# Patient Record
Sex: Male | Born: 1985 | Race: Black or African American | Hispanic: No | Marital: Single | Smoking: Former smoker
Health system: Southern US, Community
[De-identification: ages and names within clinical notes are randomized; demographics above are authoritative.]

## PROBLEM LIST (undated history)

## (undated) DIAGNOSIS — M109 Gout, unspecified: Secondary | ICD-10-CM

## (undated) DIAGNOSIS — I10 Essential (primary) hypertension: Secondary | ICD-10-CM

---
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
## 2006-09-03 ENCOUNTER — Emergency Department: Payer: Self-pay | Admitting: Emergency Medicine

## 2007-05-11 ENCOUNTER — Emergency Department: Payer: Self-pay | Admitting: Emergency Medicine

## 2008-03-05 ENCOUNTER — Emergency Department: Payer: Self-pay | Admitting: Emergency Medicine

## 2011-04-23 ENCOUNTER — Ambulatory Visit: Payer: Self-pay | Admitting: Internal Medicine

## 2013-01-30 ENCOUNTER — Ambulatory Visit: Payer: Self-pay | Admitting: Emergency Medicine

## 2014-10-29 ENCOUNTER — Ambulatory Visit
Admission: EM | Admit: 2014-10-29 | Discharge: 2014-10-29 | Disposition: A | Discharge status: Home / Self Care | Payer: 59 | Attending: Family Medicine | Admitting: Family Medicine

## 2014-10-29 ENCOUNTER — Ambulatory Visit: Payer: 59

## 2014-10-29 ENCOUNTER — Encounter: Payer: Self-pay | Admitting: Emergency Medicine

## 2014-10-29 DIAGNOSIS — F17200 Nicotine dependence, unspecified, uncomplicated: Secondary | ICD-10-CM | POA: Diagnosis not present

## 2014-10-29 DIAGNOSIS — J40 Bronchitis, not specified as acute or chronic: Secondary | ICD-10-CM | POA: Diagnosis not present

## 2014-10-29 DIAGNOSIS — R0981 Nasal congestion: Secondary | ICD-10-CM | POA: Diagnosis present

## 2014-10-29 MED ORDER — HYDROCOD POLST-CPM POLST ER 10-8 MG/5ML PO SUER: 5.0000 mL | Freq: Two times a day (BID) | ORAL | Status: DC

## 2014-10-29 MED ORDER — ALBUTEROL SULFATE HFA 108 (90 BASE) MCG/ACT IN AERS
1.0000 | INHALATION_SPRAY | Freq: Four times a day (QID) | RESPIRATORY_TRACT | Status: DC | PRN
Start: 2014-10-29 — End: 2016-05-08

## 2014-10-29 MED ORDER — AZITHROMYCIN 250 MG PO TABS
ORAL_TABLET | ORAL | Status: DC
Start: 2014-10-29 — End: 2016-05-08

## 2014-10-29 NOTE — ED Provider Notes (Signed)
CSN: [id number]     Arrival date & time 10/29/14  0730 History   First MD Initiated Contact with Patient 10/29/14 [phone]     Chief Complaint  Patient presents with  . Nasal Congestion   (Consider location/radiation/quality/duration/timing/severity/associated sxs/prior Treatment) HPI This a 29 year old gentleman who presents with a one-week history of difficulty breathing and chest congestion mildly productive cough tightness in his chest and back that has worsened over the last day or so. In addition he has a sore throat from trying to cough to produce phlegm. The sputum is whitish without any mention of green or yellow. He is a smoker of a half a pack per week and has smoked for many years. He works in a factory with "a Orthoptist. In the past he has been diagnosed with bronchitis but this is been several years ago.  History reviewed. No pertinent past medical history. History reviewed. No pertinent past surgical history. History reviewed. No pertinent family history. Social History  Substance Use Topics  . Smoking status: Current Every Day Smoker  . Smokeless tobacco: None  . Alcohol Use: Yes    Review of Systems  HENT: Positive for congestion and sore throat.   Respiratory: Positive for cough, chest tightness, shortness of breath and wheezing.   All other systems reviewed and are negative.   Allergies  Review of patient's allergies indicates no known allergies.  Home Medications   Prior to Admission medications   Medication Sig Start Date End Date Taking? Authorizing Provider  albuterol (PROVENTIL HFA;VENTOLIN HFA) 108 (90 BASE) MCG/ACT inhaler Inhale 1-2 puffs into the lungs every 6 (six) hours as needed for wheezing or shortness of breath. 10/29/14   Lutricia Feil, PA-C  azithromycin (ZITHROMAX Z-PAK) 250 MG tablet Take as per package instructions. 10/29/14   Lutricia Feil, PA-C  chlorpheniramine-HYDROcodone (TUSSIONEX PENNKINETIC ER) 10-8  MG/5ML SUER Take 5 mLs by mouth 2 (two) times daily. 10/29/14   Chrissie Noa Chaselyn Nanney, PA-C   BP 156/103 mmHg  Pulse 72  Temp(Src) 98 F (36.7 C) (Tympanic)  Resp 16  Ht  (1.753 m)  Wt 205 lb (92.987 kg)  BMI 30.26 kg/m2  SpO2 99% Physical Exam  Constitutional: He is oriented to person, place, and time. He appears well-developed and well-nourished.  HENT:  Head: Normocephalic and atraumatic.  Right Ear: External ear normal.  Left Ear: External ear normal.  Mouth/Throat: Oropharynx is clear and moist.  Eyes: Pupils are equal, round, and reactive to light.  Neck: Neck supple. No thyromegaly present.  Pulmonary/Chest: No stridor. No respiratory distress. He has wheezes. He has rales.  Musculoskeletal: Normal range of motion. He exhibits no edema or tenderness.  Lymphadenopathy:    He has no cervical adenopathy.  Neurological: He is alert and oriented to person, place, and time.  Skin: Skin is warm and dry.  Psychiatric: He has a normal mood and affect. His behavior is normal. Judgment and thought content normal.  Nursing note and vitals reviewed.   ED Course  Procedures (including critical care time) Labs Review Labs Reviewed - No data to display  Imaging Review Dg Chest 2 View  10/29/2014   CLINICAL DATA:  Chest tightness, difficulty breathing for 2 weeks, former smoking history  EXAM: CHEST  2 VIEW  COMPARISON:  None.  FINDINGS: The heart size and mediastinal contours are within normal limits. Both lungs are clear. The visualized skeletal structures are unremarkable.  IMPRESSION: No active cardiopulmonary disease.  Electronically Signed   By: Dwyane Dee M.D.   On: 10/29/2014 08:46     MDM   1. Bronchitis    New Prescriptions   ALBUTEROL (PROVENTIL HFA;VENTOLIN HFA) 108 (90 BASE) MCG/ACT INHALER    Inhale 1-2 puffs into the lungs every 6 (six) hours as needed for wheezing or shortness of breath.   AZITHROMYCIN (ZITHROMAX Z-PAK) 250 MG TABLET    Take as per package  instructions.   CHLORPHENIRAMINE-HYDROCODONE (TUSSIONEX PENNKINETIC ER) 10-8 MG/5ML SUER    Take 5 mLs by mouth 2 (two) times daily.   Plan: 1. Test/x-ray results and diagnosis reviewed with patient 2. rx as per orders; risks, benefits, potential side effects reviewed with patient 3. Recommend supportive treatment with albuterol,tussiones. Drink fluids. Stop smoking! 4. F/u prn if symptoms worsen or don't improve     Lutricia Feil, PA-C 10/29/14 [phone]

## 2014-10-29 NOTE — Discharge Instructions (Signed)
Upper Respiratory Infection, Adult An upper respiratory infection (URI) is also sometimes known as the common cold. The upper respiratory tract includes the nose, sinuses, throat, trachea, and bronchi. Bronchi are the airways leading to the lungs. Most people improve within 1 week, but symptoms can last up to 2 weeks. A residual cough may last even longer.  CAUSES Many different viruses can infect the tissues lining the upper respiratory tract. The tissues become irritated and inflamed and often become very moist. Mucus production is also common. A cold is contagious. You can easily spread the virus to others by oral contact. This includes kissing, sharing a glass, coughing, or sneezing. Touching your mouth or nose and then touching a surface, which is then touched by another person, can also spread the virus. SYMPTOMS  Symptoms typically develop 1 to 3 days after you come in contact with a cold virus. Symptoms vary from person to person. They may include: 1. Runny nose. 2. Sneezing. 3. Nasal congestion. 4. Sinus irritation. 5. Sore throat. 6. Loss of voice (laryngitis). 7. Cough. 8. Fatigue. 9. Muscle aches. 10. Loss of appetite. 11. Headache. 12. Low-grade fever. DIAGNOSIS  You might diagnose your own cold based on familiar symptoms, since most people get a cold 2 to 3 times a year. Your caregiver can confirm this based on your exam. Most importantly, your caregiver can check that your symptoms are not due to another disease such as strep throat, sinusitis, pneumonia, asthma, or epiglottitis. Blood tests, throat tests, and X-rays are not necessary to diagnose a common cold, but they may sometimes be helpful in excluding other more serious diseases. Your caregiver will decide if any further tests are required. RISKS AND COMPLICATIONS  You may be at risk for a more severe case of the common cold if you smoke cigarettes, have chronic heart disease (such as heart failure) or lung disease (such as  asthma), or if you have a weakened immune system. The very young and very old are also at risk for more serious infections. Bacterial sinusitis, middle ear infections, and bacterial pneumonia can complicate the common cold. The common cold can worsen asthma and chronic obstructive pulmonary disease (COPD). Sometimes, these complications can require emergency medical care and may be life-threatening. PREVENTION  The best way to protect against getting a cold is to practice good hygiene. Avoid oral or hand contact with people with cold symptoms. Wash your hands often if contact occurs. There is no clear evidence that vitamin C, vitamin E, echinacea, or exercise reduces the chance of developing a cold. However, it is always recommended to get plenty of rest and practice good nutrition. TREATMENT  Treatment is directed at relieving symptoms. There is no cure. Antibiotics are not effective, because the infection is caused by a virus, not by bacteria. Treatment may include:  Increased fluid intake. Sports drinks offer valuable electrolytes, sugars, and fluids.  Breathing heated mist or steam (vaporizer or shower).  Eating chicken soup or other clear broths, and maintaining good nutrition.  Getting plenty of rest.  Using gargles or lozenges for comfort.  Controlling fevers with ibuprofen or acetaminophen as directed by your caregiver.  Increasing usage of your inhaler if you have asthma. Zinc gel and zinc lozenges, taken in the first 24 hours of the common cold, can shorten the duration and lessen the severity of symptoms. Pain medicines may help with fever, muscle aches, and throat pain. A variety of non-prescription medicines are available to treat congestion and runny nose. Your caregiver  can make recommendations and may suggest nasal or lung inhalers for other symptoms.  HOME CARE INSTRUCTIONS   Only take over-the-counter or prescription medicines for pain, discomfort, or fever as directed by your  caregiver.  Use a warm mist humidifier or inhale steam from a shower to increase air moisture. This may keep secretions moist and make it easier to breathe.  Drink enough water and fluids to keep your urine clear or pale yellow.  Rest as needed.  Return to work when your temperature has returned to normal or as your caregiver advises. You may need to stay home longer to avoid infecting others. You can also use a face mask and careful hand washing to prevent spread of the virus. SEEK MEDICAL CARE IF:   After the first few days, you feel you are getting worse rather than better.  You need your caregiver's advice about medicines to control symptoms.  You develop chills, worsening shortness of breath, or brown or red sputum. These may be signs of pneumonia.  You develop yellow or brown nasal discharge or pain in the face, especially when you bend forward. These may be signs of sinusitis.  You develop a fever, swollen neck glands, pain with swallowing, or white areas in the back of your throat. These may be signs of strep throat. SEEK IMMEDIATE MEDICAL CARE IF:   You have a fever.  You develop severe or persistent headache, ear pain, sinus pain, or chest pain.  You develop wheezing, a prolonged cough, cough up blood, or have a change in your usual mucus (if you have chronic lung disease).  You develop sore muscles or a stiff neck. Document Released: 08/25/2000 Document Revised: 05/24/2011 Document Reviewed: 06/06/2013 [facility] Patient Information 2015 [place], [place]. This information is not intended to replace advice given to you by your health care provider. Make sure you discuss any questions you have with your health care provider.  How to Use an Inhaler Using your inhaler correctly is very important. Good technique will make sure that the medicine reaches your lungs.  HOW TO USE AN INHALER: 13. Take the cap off the inhaler. 14. If this is the first time using your inhaler, you  need to prime it. Shake the inhaler for 5 seconds. Release four puffs into the air, away from your face. Ask your doctor for help if you have questions. 15. Shake the inhaler for 5 seconds. 16. Turn the inhaler so the bottle is above the mouthpiece. 17. Put your pointer finger on top of the bottle. Your thumb holds the bottom of the inhaler. 18. Open your mouth. 19. Either hold the inhaler away from your mouth (the width of 2 fingers) or place your lips tightly around the mouthpiece. Ask your doctor which way to use your inhaler. 20. Breathe out as much air as possible. 21. Breathe in and push down on the bottle 1 time to release the medicine. You will feel the medicine go in your mouth and throat. 22. Continue to take a deep breath in very slowly. Try to fill your lungs. 23. After you have breathed in completely, hold your breath for 10 seconds. This will help the medicine to settle in your lungs. If you cannot hold your breath for 10 seconds, hold it for as long as you can before you breathe out. 24. Breathe out slowly, through pursed lips. Whistling is an example of pursed lips. 25. If your doctor has told you to take more than 1 puff, wait at least  15-30 seconds between puffs. This will help you get the best results from your medicine. Do not use the inhaler more than your doctor tells you to. 26. Put the cap back on the inhaler. 27. Follow the directions from your doctor or from the inhaler package about cleaning the inhaler. If you use more than one inhaler, ask your doctor which inhalers to use and what order to use them in. Ask your doctor to help you figure out when you will need to refill your inhaler.  If you use a steroid inhaler, always rinse your mouth with water after your last puff, gargle and spit out the water. Do not swallow the water. GET HELP IF:  The inhaler medicine only partially helps to stop wheezing or shortness of breath.  You are having trouble using your  inhaler.  You have some increase in thick spit (phlegm). GET HELP RIGHT AWAY IF:  The inhaler medicine does not help your wheezing or shortness of breath or you have tightness in your chest.  You have dizziness, headaches, or fast heart rate.  You have chills, fever, or night sweats.  You have a large increase of thick spit, or your thick spit is bloody. MAKE SURE YOU:   Understand these instructions.  Will watch your condition.  Will get help right away if you are not doing well or get worse. Document Released: 12/09/2007 Document Revised: 12/20/2012 Document Reviewed: 09/28/2012 [facility] Patient Information 2015 [place], [place]. This information is not intended to replace advice given to you by your health care provider. Make sure you discuss any questions you have with your health care provider.

## 2014-10-29 NOTE — ED Notes (Signed)
Pt with nasal and chest congestion x 2 days

## 2016-05-08 ENCOUNTER — Ambulatory Visit
Admission: EM | Admit: 2016-05-08 | Discharge: 2016-05-08 | Disposition: A | Discharge status: Home / Self Care | Payer: 59 | Attending: Emergency Medicine | Admitting: Emergency Medicine

## 2016-05-08 ENCOUNTER — Ambulatory Visit (INDEPENDENT_AMBULATORY_CARE_PROVIDER_SITE_OTHER): Payer: 59

## 2016-05-08 ENCOUNTER — Encounter: Payer: Self-pay | Admitting: *Deleted

## 2016-05-08 DIAGNOSIS — M79672 Pain in left foot: Secondary | ICD-10-CM

## 2016-05-08 DIAGNOSIS — I10 Essential (primary) hypertension: Secondary | ICD-10-CM | POA: Diagnosis not present

## 2016-05-08 DIAGNOSIS — M722 Plantar fascial fibromatosis: Secondary | ICD-10-CM | POA: Diagnosis not present

## 2016-05-08 HISTORY — DX: Essential (primary) hypertension: I10

## 2016-05-08 LAB — BASIC METABOLIC PANEL
Anion gap: 8 (ref 5–15)
BUN: 12 mg/dL (ref 6–20)
CHLORIDE: 103 mmol/L (ref 101–111)
CO2: 28 mmol/L (ref 22–32)
Calcium: 8.9 mg/dL (ref 8.9–10.3)
Creatinine, Ser: 1.03 mg/dL (ref 0.61–1.24)
GFR calc non Af Amer: >60 mL/min (ref >60)
Glucose, Bld: 105 mg/dL — ABNORMAL HIGH (ref 65–99)
POTASSIUM: 3.5 mmol/L (ref 3.5–5.1)
Sodium: 139 mmol/L (ref 135–145)

## 2016-05-08 MED ORDER — IBUPROFEN 800 MG PO TABS: 800.0000 mg | ORAL_TABLET | Freq: Three times a day (TID) | ORAL | 0 refills | Status: DC

## 2016-05-08 MED ORDER — HYDROCHLOROTHIAZIDE 12.5 MG PO TABS: 12.5000 mg | ORAL_TABLET | Freq: Every day | ORAL | 0 refills | Status: DC

## 2016-05-08 NOTE — ED Provider Notes (Signed)
HPI  SUBJECTIVE:  Terry Buchanan is a 31 y.o. male who presents with 2-3 days of atraumatic sharp, stabbing, achy constant pain in his lateral left foot. States it is also on the sole of his foot. Symptoms are worse with weightbearing, better with ibuprofen 800 mg. Last dose of ibuprofen was last night. He has not tried anything else for this. He thinks that he might of heard a "pop" several days ago, but is not sure. He thinks that there may be some swelling. He has had symptoms like this before in the other foot and was thought to have a stress fracture. No back, leg, ankle pain. He wears steel toed beats but states that they fit well. No bruising, redness, fevers, rash, numbness, tingling. He reports pain with heel strike in the morning. No change in his physical activity. He has a past medical history of hypertension. He was on hydrochlorothiazide. Has not taken BP medicines in a year. Does not have a primary care physician. No history of diabetes or osteoporosis. PMD: None.    Past Medical History:  Diagnosis Date  . Hypertension     History reviewed. No pertinent surgical history.  History reviewed. No pertinent family history.  Social History  Substance Use Topics  . Smoking status: Former Games developermoker  . Smokeless tobacco: Never Used  . Alcohol use Yes    No current facility-administered medications for this encounter.   Current Outpatient Prescriptions:  .  hydrochlorothiazide (HYDRODIURIL) 12.5 MG tablet, Take 1 tablet (12.5 mg total) by mouth daily., Disp: 30 tablet, Rfl: 0 .  ibuprofen (ADVIL,MOTRIN) 800 MG tablet, Take 1 tablet (800 mg total) by mouth 3 (three) times daily., Disp: 30 tablet, Rfl: 0  No Known Allergies   ROS  As noted in HPI.   Physical Exam  BP (!) 166/111 (BP Location: Left Arm)   Pulse 64   Temp 98.6 F (37 C) (Oral)   Resp 16   Ht 5\' 9"  (1.753 m)   Wt 230 lb (104.3 kg)   SpO2 98%   BMI 33.97 kg/m   BP Readings from Last 3 Encounters:   05/08/16 (!) 166/111  10/29/14 (!) 156/103    Constitutional: Well developed, well nourished, no acute distress Eyes:  EOMI, conjunctiva normal bilaterally HENT: Normocephalic, atraumatic,mucus membranes moist Respiratory: Normal inspiratory effort Cardiovascular: Normal rate GI: nondistended skin: No rash, skin intact Musculoskeletal: Ankle nontender. Nontender along the medial or lateral ankle ligaments. No foot erythema,  Bruising. Mild soft tissue swelling.  Midfoot nontender. Positive tenderness to the base fifth metatarsal. Positive tenderness over the plantar fascia. Mild tenderness along the calcaneus at the insertion of the plantar fascia. No rash. Patient able to wiggle all toes. DP 2+. Neurologic: Alert & oriented x 3, no focal neuro deficits Psychiatric: Speech and behavior appropriate   ED Course   Medications - No data to display  Orders Placed This Encounter  Procedures  . DG Foot Complete Left    Standing Status:   Standing    Number of Occurrences:   1    Order Specific Question:   Reason for Exam (SYMPTOM  OR DIAGNOSIS REQUIRED)    Answer:   5th MT tenderness r/o foot fx  . Basic metabolic panel    Standing Status:   Standing    Number of Occurrences:   1    Results for orders placed or performed during the hospital encounter of 05/08/16 (from the past 24 hour(s))  Basic metabolic panel  Status: Abnormal   Collection Time: 05/08/16  9:31 AM  Result Value Ref Range   Sodium 139 135 - 145 mmol/L   Potassium 3.5 3.5 - 5.1 mmol/L   Chloride 103 101 - 111 mmol/L   CO2 28 22 - 32 mmol/L   Glucose, Bld 105 (H) 65 - 99 mg/dL   BUN 12 6 - 20 mg/dL   Creatinine, Ser 4.09 0.61 - 1.24 mg/dL   Calcium 8.9 8.9 - 81.1 mg/dL   GFR calc non Af Amer >60 >60 mL/min   GFR calc Af Amer >60 >60 mL/min   Anion gap 8 5 - 15   Dg Foot Complete Left  Result Date: 05/08/2016 CLINICAL DATA:  Lateral left foot pain for 3 days. No known injury. Initial encounter. EXAM:  LEFT FOOT - COMPLETE 3+ VIEW COMPARISON:  None. FINDINGS: There is no evidence of acute fracture, subluxation or dislocation. Mild degenerative changes at the lateral cuneiform- third metatarsal articulation noted. No other focal bony abnormalities are present. Soft tissue swelling is noted. IMPRESSION: Soft tissue swelling without acute bony abnormality. Degenerative changes of the lateral cuneiform -third metatarsal articulation. Electronically Signed   By: Harmon Pier M.D.   On: 05/08/2016 09:58    ED Clinical Impression  Foot pain, left  Plantar fasciitis of left foot  Hypertension, unspecified type  ED Assessment/Plan  [facility] narcotic database reviewed. No opiate  prescriptions in the past 6 months.  Imaging independently reviewed. Soft tissue swelling. No acute bony abnormalities. Mild degenerative changes in the lateral cuneiform third metatarsal articulation. See radiology report for details.   Presentation most consistent with a plantar fasciitis of his left foot. We will send him home with stretching exercises, heel cup, ibuprofen 800 mg with 1 g of Tylenol 3 times a day, podiatry referral.   Pt hypertensive today.  Has not taken BP meds in a year He is otherwise asymptomatic today. Pt denies any CNS type sx such as HA, visual changes, focal paresis, or new onset seizure activity. Pt denies any CV sx such as CP, dyspnea, palpitations, pedal edema, tearing pain radiating to back or abd. Pt denied any renal sx such as anuria or hematuria. . Discussed importance of lifestyle modifications as important first steps. We'll also check BMP and restart on hydrochlorothiazide 12.5 mg qd to begin with. Pt to f/u with a primary care physician of his choice as OP.   Will provide primary care referral. Patient also may return here for blood pressure recheck.  BMP normal. Potassium is borderline low, we'll have him increase his intake of potassium rich foods. Plan as above.  Discussed labs,  imaging, MDM, plan and followup with patient. Discussed sn/sx that should prompt return to the ED. Patient  agrees with plan.   Meds ordered this encounter  Medications  . ibuprofen (ADVIL,MOTRIN) 800 MG tablet    Sig: Take 1 tablet (800 mg total) by mouth 3 (three) times daily.    Dispense:  30 tablet    Refill:  0  . hydrochlorothiazide (HYDRODIURIL) 12.5 MG tablet    Sig: Take 1 tablet (12.5 mg total) by mouth daily.    Dispense:  30 tablet    Refill:  0    *This clinic note was created using Scientist, clinical (histocompatibility and immunogenetics). Therefore, there may be occasional mistakes despite careful proofreading.  ?   Domenick Gong, MD 05/08/16 1115

## 2016-05-08 NOTE — Discharge Instructions (Signed)
Decrease your salt intake. diet and exercise will lower your blood pressure significantly. It is important to keep your blood pressure under good control, as having a elevated for prolonged periods of time significantly increases your risk of stroke, heart attacks, kidney damage, eye damage, and other problems. Increase your intake of potassium rich foods such as bananas and orange juice. Take a multivitamin that contains potassium minute every day. Measure your blood pressure once a day, preferably at the same time every day. Keep a log of this and bring it to your next doctor's appointment. Return here in a week for blood pressure recheck if you're able to find a primary care physician by then. Return immediately to the ER if you start having chest pain, headache, problems seeing, problems talking, problems walking, if you feel like you're about to pass out, if you do pass out, if you have a seizure, or for any other concerns..Marland Kitchen

## 2016-05-08 NOTE — ED Triage Notes (Signed)
Gradual onset left foot pain and edema over past 4 days. Pt can't state specific incident of injury. States he is on his feet all day at work and stands on concrete wearing steele toe boots.

## 2018-10-18 ENCOUNTER — Other Ambulatory Visit: Payer: Self-pay

## 2018-10-18 DIAGNOSIS — Z20822 Contact with and (suspected) exposure to covid-19: Secondary | ICD-10-CM

## 2018-10-19 LAB — NOVEL CORONAVIRUS, NAA: SARS-CoV-2, NAA: NOT DETECTED

## 2018-12-27 ENCOUNTER — Other Ambulatory Visit: Payer: Self-pay

## 2018-12-27 DIAGNOSIS — Z20822 Contact with and (suspected) exposure to covid-19: Secondary | ICD-10-CM

## 2018-12-28 LAB — NOVEL CORONAVIRUS, NAA: SARS-CoV-2, NAA: NOT DETECTED

## 2019-02-12 ENCOUNTER — Other Ambulatory Visit: Payer: Self-pay

## 2019-02-12 DIAGNOSIS — Z20822 Contact with and (suspected) exposure to covid-19: Secondary | ICD-10-CM

## 2019-02-14 LAB — NOVEL CORONAVIRUS, NAA: SARS-CoV-2, NAA: NOT DETECTED

## 2019-07-10 ENCOUNTER — Ambulatory Visit
Admission: EM | Admit: 2019-07-10 | Discharge: 2019-07-10 | Disposition: A | Discharge status: Home / Self Care | Payer: 59 | Attending: Emergency Medicine | Admitting: Emergency Medicine

## 2019-07-10 ENCOUNTER — Ambulatory Visit (INDEPENDENT_AMBULATORY_CARE_PROVIDER_SITE_OTHER)
Admission: RE | Admit: 2019-07-10 | Discharge: 2019-07-10 | Disposition: A | Discharge status: Home / Self Care | Payer: 59 | Source: Ambulatory Visit | Attending: Emergency Medicine | Admitting: Emergency Medicine

## 2019-07-10 ENCOUNTER — Other Ambulatory Visit: Payer: Self-pay

## 2019-07-10 DIAGNOSIS — M25561 Pain in right knee: Secondary | ICD-10-CM | POA: Diagnosis not present

## 2019-07-10 DIAGNOSIS — I10 Essential (primary) hypertension: Secondary | ICD-10-CM | POA: Diagnosis present

## 2019-07-10 DIAGNOSIS — R030 Elevated blood-pressure reading, without diagnosis of hypertension: Secondary | ICD-10-CM | POA: Insufficient documentation

## 2019-07-10 HISTORY — DX: Gout, unspecified: M10.9

## 2019-07-10 LAB — BASIC METABOLIC PANEL
Anion gap: 8 (ref 5–15)
BUN: 10 mg/dL (ref 6–20)
CO2: 29 mmol/L (ref 22–32)
Calcium: 9 mg/dL (ref 8.9–10.3)
Chloride: 100 mmol/L (ref 98–111)
Creatinine, Ser: 1.16 mg/dL (ref 0.61–1.24)
GFR calc Af Amer: >60 mL/min (ref >60)
GFR calc non Af Amer: >60 mL/min (ref >60)
Glucose, Bld: 110 mg/dL — ABNORMAL HIGH (ref 70–99)
Potassium: 3.5 mmol/L (ref 3.5–5.1)
Sodium: 137 mmol/L (ref 135–145)

## 2019-07-10 MED ORDER — OXYCODONE-ACETAMINOPHEN 5-325 MG PO TABS: 1.0000 | ORAL_TABLET | Freq: Four times a day (QID) | ORAL | 0 refills | Status: DC | PRN

## 2019-07-10 MED ORDER — LISINOPRIL 10 MG PO TABS: 10.0000 mg | ORAL_TABLET | Freq: Every day | ORAL | 0 refills | Status: DC

## 2019-07-10 MED ORDER — PREDNISONE 10 MG PO TABS: ORAL_TABLET | ORAL | 0 refills | Status: DC

## 2019-07-10 NOTE — ED Provider Notes (Signed)
[facility] ____________________________________________  Time seen: Approximately 3:41 PM  I have reviewed the triage vital signs and the nursing notes.   HISTORY  Chief Complaint Knee Pain (Right )  HPI Terry Buchanan is a 34 y.o. male past medical history of gout and hypertension presenting for evaluation of right knee pain present for the last 1 week.  Patient states pain has been present for 1 week but increased this past weekend.  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  Denies any fall, direct injury or abrupt onset.  Denies giving way.  States has had swelling to the knee itself.  Denies any other pain or swelling to the rest of the leg.  Denies recurrent history of this.  Has had previous gout with some similar presentation to right foot.  Denies any fevers, redness, break in skin, chest pain, shortness of breath, dizziness, vision changes or headaches.  Reports otherwise doing well.  Did take Tylenol intermittently which helps some but no resolution.  Activity worsens pain.  Denies other alleviating measures.  Reports has been checking his blood pressure at home recently as well and getting 140s over 100s.   Past Medical History:  Diagnosis Date  . Gout   . Hypertension     There are no problems to display for this patient.   History reviewed. No pertinent surgical history.   No current facility-administered medications for this encounter.  Current Outpatient Medications:  .  ibuprofen (ADVIL,MOTRIN) 800 MG tablet, Take 1 tablet (800 mg total) by mouth 3 (three) times daily., Disp: 30 tablet, Rfl: 0 .  hydrochlorothiazide (HYDRODIURIL) 12.5 MG tablet, Take 1 tablet (12.5 mg total) by mouth daily., Disp: 30 tablet, Rfl: 0 .  lisinopril (ZESTRIL) 10 MG tablet, Take 1 tablet (10 mg total) by mouth daily., Disp: 30 tablet, Rfl: 0 .   oxyCODONE-acetaminophen (PERCOCET/ROXICET) 5-325 MG tablet, Take 1 tablet by mouth every 6 (six) hours as needed for severe pain. Do not drive while taking as can cause drowsiness., Disp: 8 tablet, Rfl: 0 .  predniSONE (DELTASONE) 10 MG tablet, Start 60 mg po day one, then 50 mg po day two, taper by 10 mg daily until complete., Disp: 21 tablet, Rfl: 0  Allergies Patient has no known allergies.  Family History  Problem Relation Age of Onset  . Cancer Father   . Hypertension Father   . Gout Father     Social History Social History   Tobacco Use  . Smoking status: Former Games developer  . Smokeless tobacco: Never Used  Substance Use Topics  . Alcohol use: Yes    Comment: occ  . Drug use: Never    Review of Systems Constitutional: No fever/chills Eyes: No visual changes. ENT: No sore throat. Cardiovascular: Denies chest pain. Respiratory: Denies shortness of breath. Gastrointestinal: No abdominal pain. Musculoskeletal: Positive right knee pain. Skin: Negative for rash. Neurological: Negative for headaches, focal weakness or numbness.    ____________________________________________   PHYSICAL EXAM:  VITAL SIGNS: ED Triage Vitals  Enc Vitals Group     BP 07/10/19 1414 (!) 189/124     Pulse Rate 07/10/19 1414 81     Resp 07/10/19 1414 18     Temp 07/10/19 1414 98.9 F (37.2 C)     Temp src --      SpO2 07/10/19 1414 97 %  Weight --      Height --      Head Circumference --      Peak Flow --      Pain Score 07/10/19 1409 8     Pain Loc --      Pain Edu? --      Excl. in [place]? --    Vitals:   07/10/19 1414 07/10/19 1514  BP: (!) 189/124 (!) 180/123  Pulse: 81   Resp: 18   Temp: 98.9 F (37.2 C)   SpO2: 97%      Constitutional: Alert and oriented. Well appearing and in no acute distress. Eyes: Conjunctivae are normal.  ENT      Head: Normocephalic and atraumatic. Cardiovascular: Normal rate, regular rhythm. Grossly normal heart sounds.  Good peripheral  circulation. Respiratory: Normal respiratory effort without tachypnea nor retractions. Breath sounds are clear and equal bilaterally. No wheezes, rales, rhonchi. Musculoskeletal: It is red with antalgic gait.  Bilateral distal pedal pulses equal and easily palpated. Except: Right knee diffuse tenderness with increased tenderness right lateral knee with effusion, pain with extension and flexion but able to near fully flex and extend, mild pain with anterior and lateral stress, no pain with posterior stress, right lower leg otherwise nontender, no distal edema, no erythema. Neurologic:  Normal speech and language. No gross focal neurologic deficits are appreciated. Skin:  Skin is warm, dry and intact. No rash noted. Psychiatric: Mood and affect are normal. Speech and behavior are normal. Patient exhibits appropriate insight and judgment   ___________________________________________   LABS (all labs ordered are listed, but only abnormal results are displayed)  Labs Reviewed  BASIC METABOLIC PANEL - Abnormal; Notable for the following components:      Result Value   Glucose, Bld 110 (*)    All other components within normal limits   ____________________________________________  RADIOLOGY  DG Knee Complete 4 Views Right  Result Date: 07/10/2019 CLINICAL DATA:  Pain and limitation of motion EXAM: RIGHT KNEE - COMPLETE 4+ VIEW COMPARISON:  None. FINDINGS: Frontal, lateral, and bilateral oblique views were obtained. No fracture or dislocation. There is a moderate joint effusion. There is no joint space narrowing or erosion. IMPRESSION: Moderate joint effusion. No fracture or dislocation. No appreciable underlying arthropathy. Electronically Signed   By: Lowella Grip III M.D.   On: 07/10/2019 15:06   ____________________________________________   PROCEDURES Procedures    INITIAL IMPRESSION / ASSESSMENT AND PLAN / ED COURSE  Pertinent labs & imaging results that were available during  my care of the patient were reviewed by me and considered in my medical decision making (see chart for details).  Well-appearing patient.  Right knee pain gradual onset.  Patient also with hypertension history, not currently on medication and currently hypertensive.  Right knee x-ray as above per radiologist, moderate effusion, no fracture or dislocation.  Suspect inflammatory pain, possible gout.  Will treat with prednisone taper and as needed Percocet as needed for breakthrough pain quantity 8 given.  Follow-up with orthopedic as needed for continued pain.  Over-the-counter knee sleeve for support.  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  Patient also reports he is currently does not have a primary care, encouraged to establish.  Reports he was previously on HCTZ 12.5, but reports he did not feel like this worked as well for him.  Will start patient on 10 mg lisinopril and keep journal, monitor diet and follow-up closely  with primary care.Discussed indication, risks and benefits of medications with patient.   Discussed follow up and return parameters including no resolution or any worsening concerns. Patient verbalized understanding and agreed to plan.   [place] controlled substance database reviewed, no recent controlled substances documented.  ____________________________________________   FINAL CLINICAL IMPRESSION(S) / ED DIAGNOSES  Final diagnoses:  Acute pain of right knee  Elevated blood pressure reading  Hypertension, unspecified type     ED Discharge Orders         Ordered    predniSONE (DELTASONE) 10 MG tablet     07/10/19 1513    oxyCODONE-acetaminophen (PERCOCET/ROXICET) 5-325 MG tablet  Every 6 hours PRN     07/10/19 1513    lisinopril (ZESTRIL) 10 MG tablet  Daily     07/10/19 1523           Note: This dictation was prepared with Dragon dictation along with smaller phrase  technology. Any transcriptional errors that result from this process are unintentional.         Renford Dills, NP 07/10/19 1551

## 2019-07-10 NOTE — ED Triage Notes (Signed)
Increasing R knee pain x 1 week.  No known injury.  Describes as constant, throbbing.  Limited ROM.  Worse with weigh bearing.  Mild swelling.  Not warm to touch.    Took Tylenol at 0100 and 0800 with some relief.

## 2019-07-10 NOTE — Discharge Instructions (Addendum)
Take medication as prescribed. Rest. Drink plenty of fluids. Elevate. Use brace as discussed.   Follow-up with orthopedic as needed for continued pain.  Monitor your blood pressure and follow-up with primary care.  Keep journal.    Return to Urgent care for new or worsening concerns.

## 2020-03-21 ENCOUNTER — Other Ambulatory Visit: Payer: 59

## 2020-03-21 ENCOUNTER — Other Ambulatory Visit: Payer: Self-pay

## 2020-03-21 DIAGNOSIS — Z20822 Contact with and (suspected) exposure to covid-19: Secondary | ICD-10-CM

## 2020-03-26 LAB — NOVEL CORONAVIRUS, NAA: SARS-CoV-2, NAA: NOT DETECTED

## 2021-09-21 IMAGING — CR DG KNEE COMPLETE 4+V*R*
4 series · 4 of 4 positions shown · non-contrast
Comparison: None.

CLINICAL DATA: Pain and limitation of motion

EXAM:
RIGHT KNEE - COMPLETE 4+ VIEW

[knee ap]
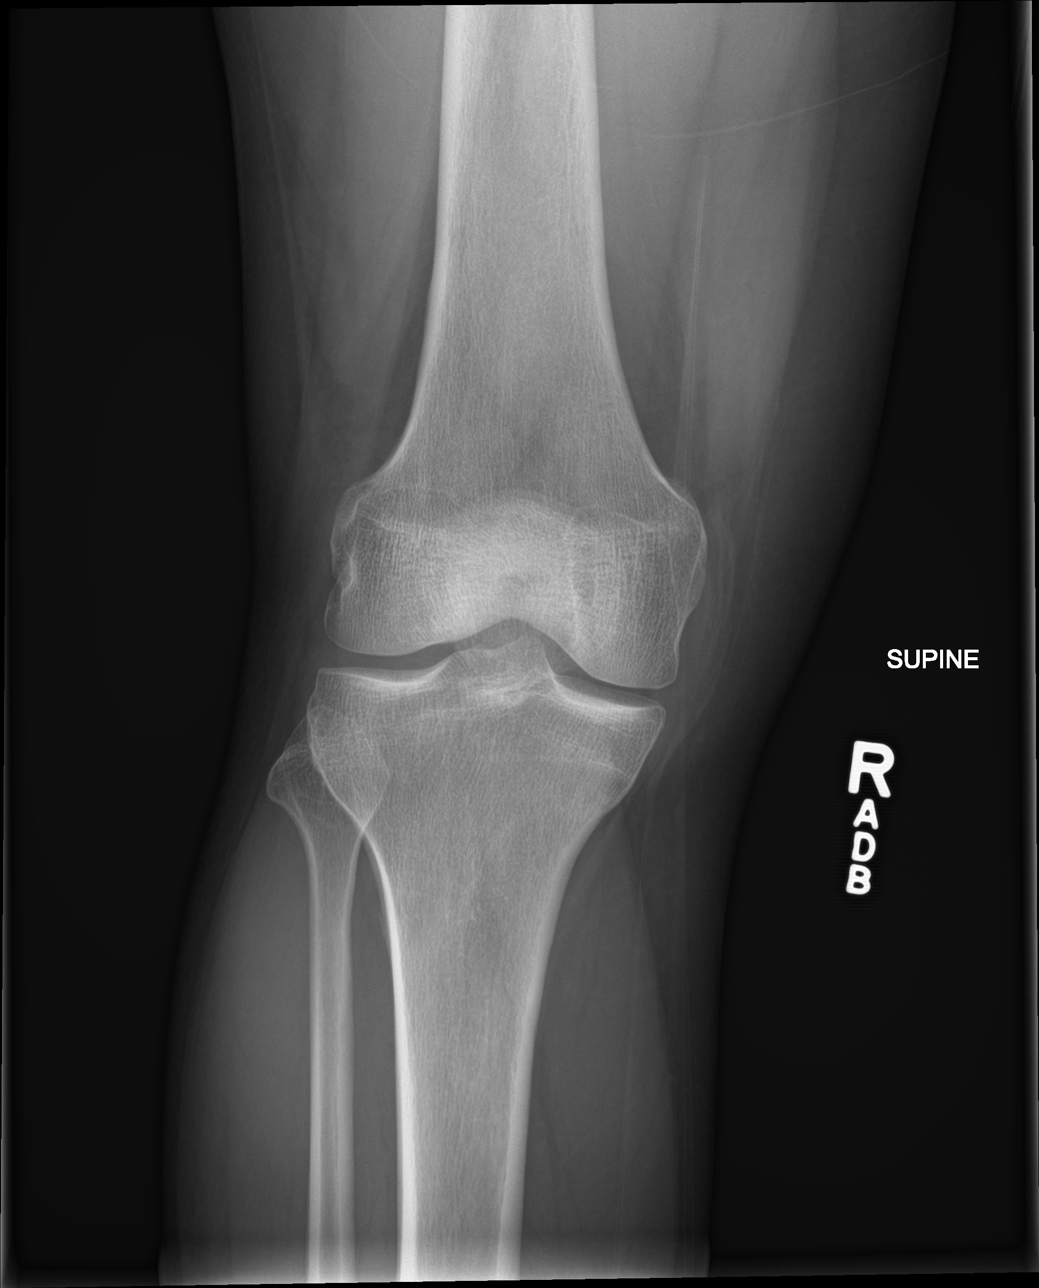

[knee lat]
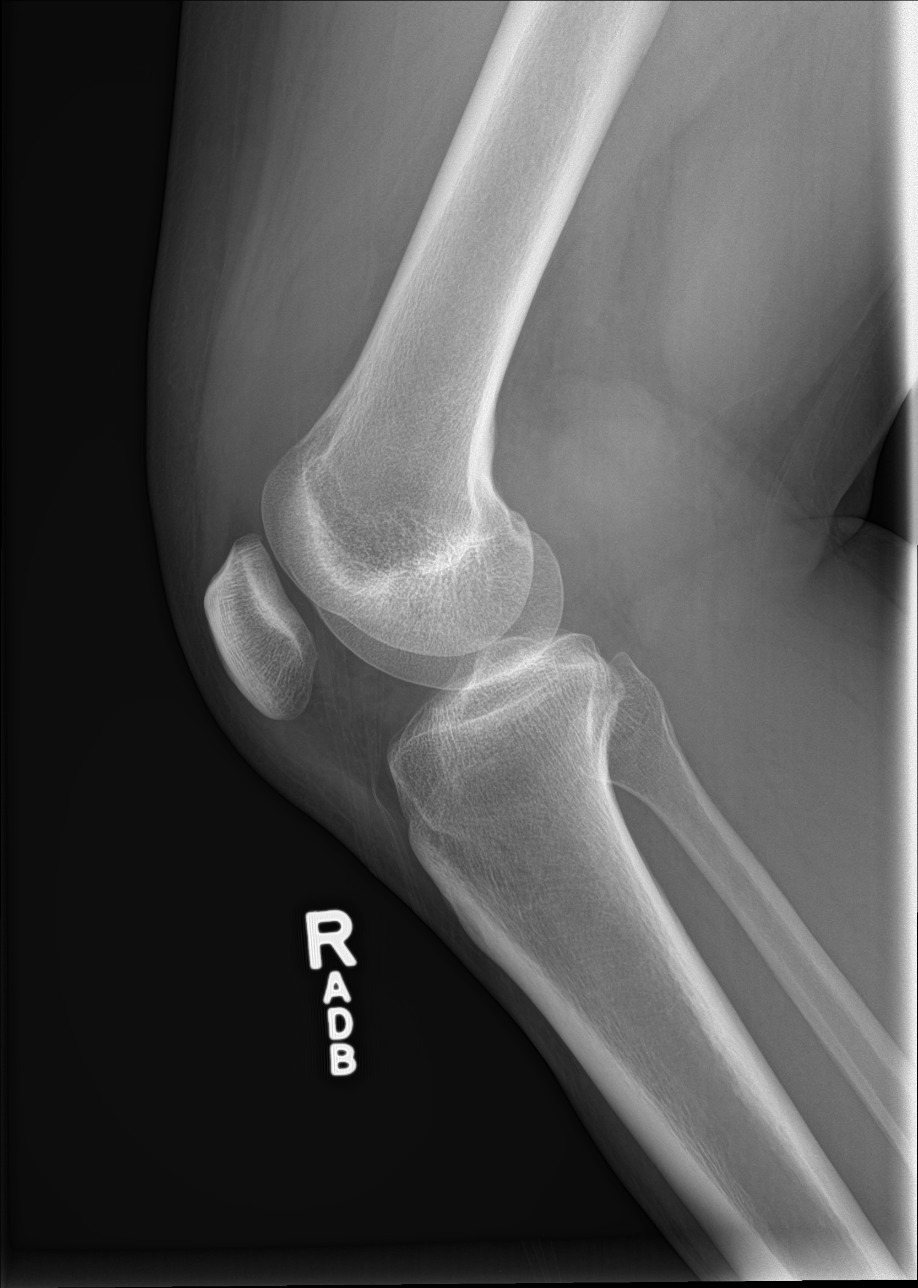

[knee obl (1 of 2)]
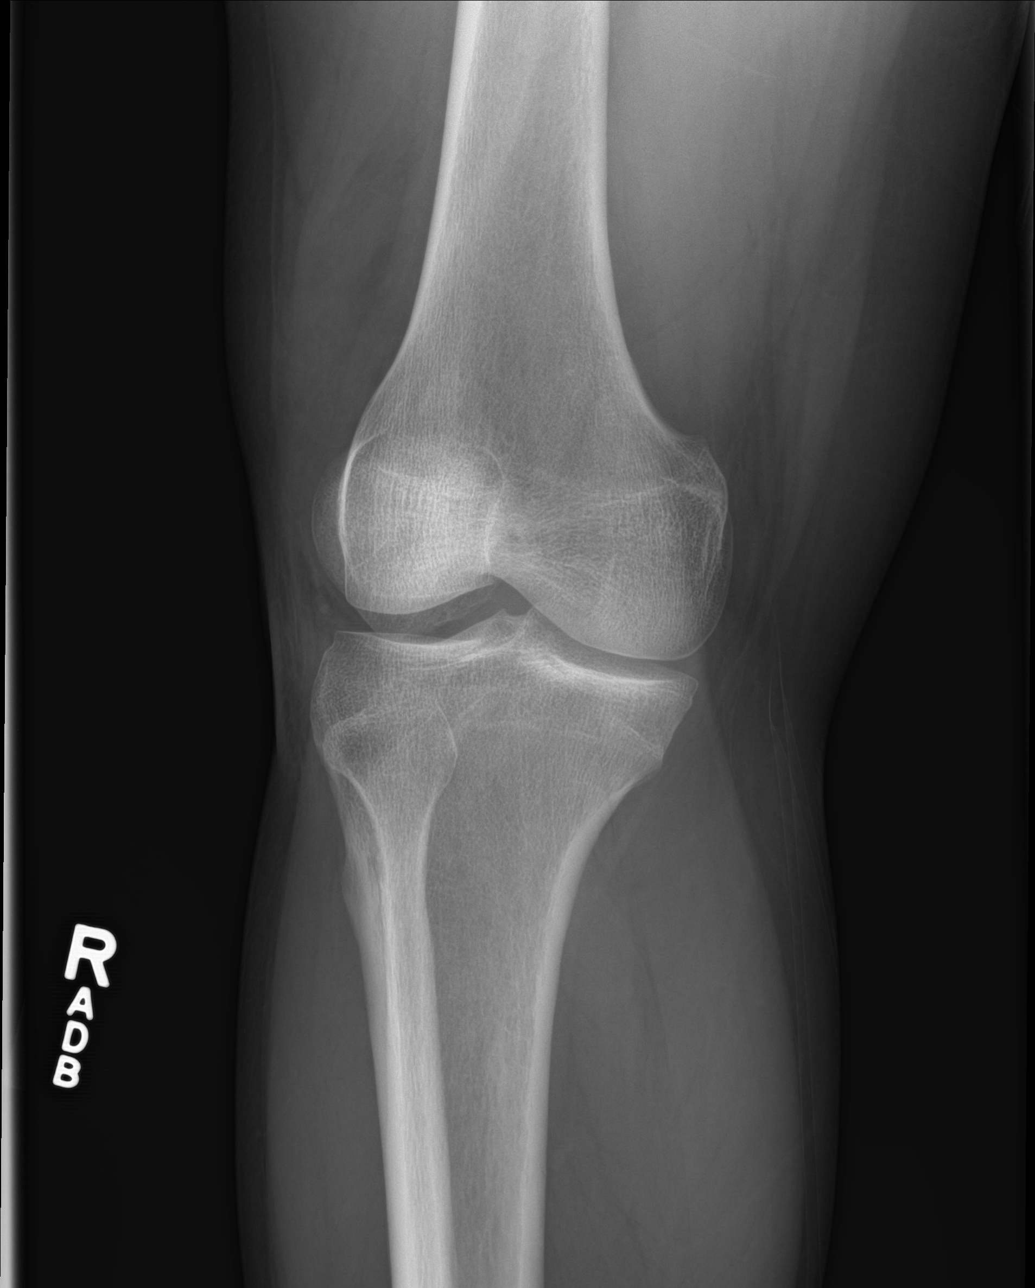

[knee obl (2 of 2)]
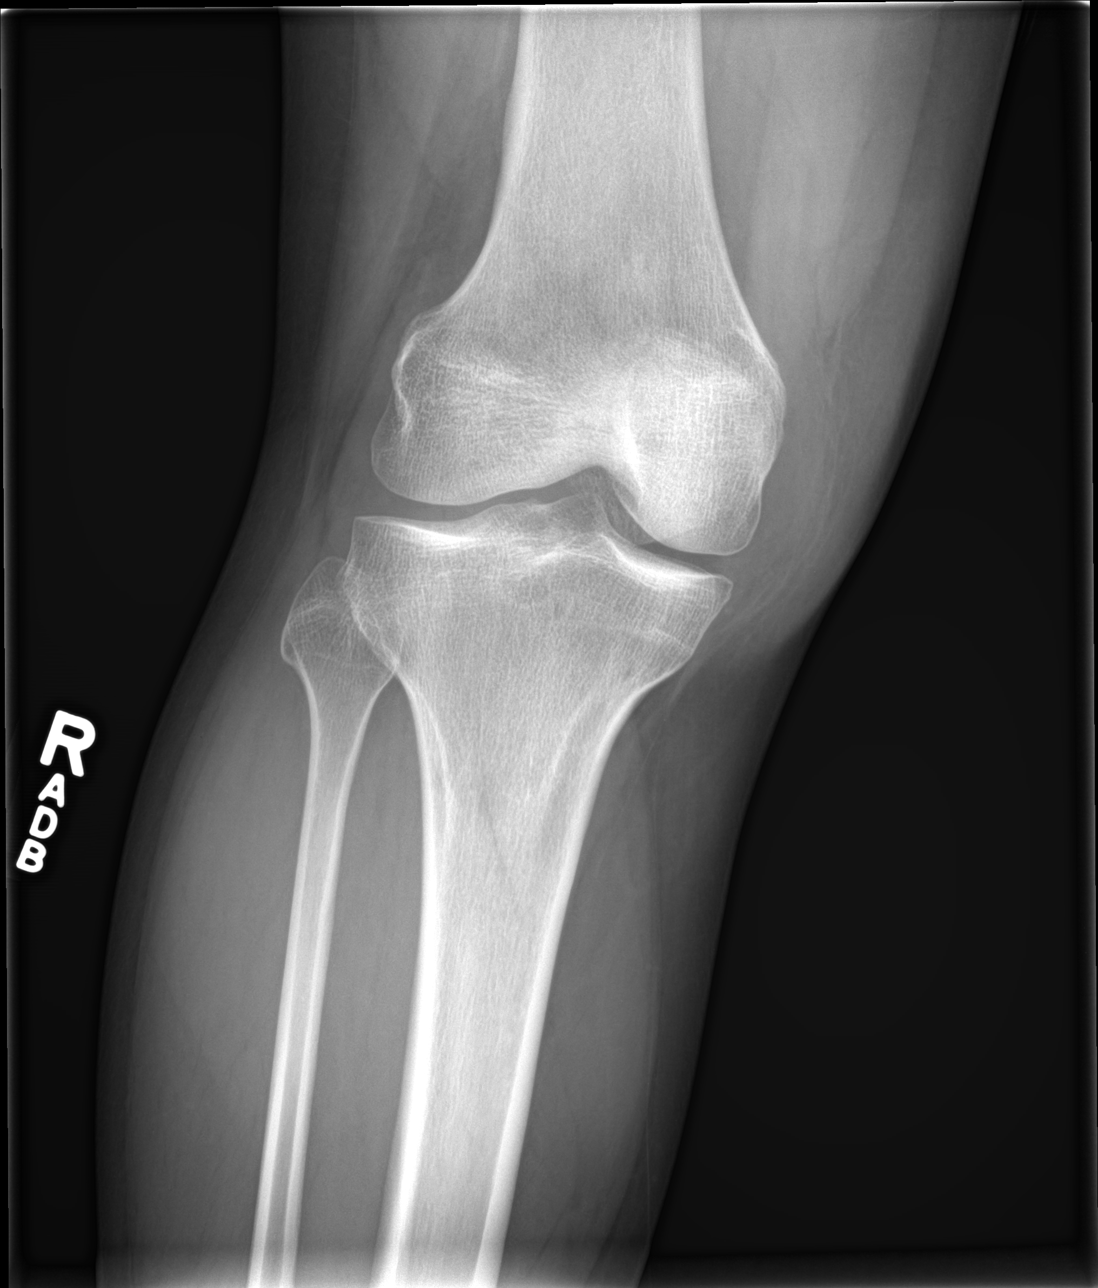

[4 of 4 positions shown; findings below may reference images not displayed]

FINDINGS: Frontal, lateral, and bilateral oblique views were obtained. No
fracture or dislocation. There is a moderate joint effusion. There
is no joint space narrowing or erosion.
IMPRESSION: Moderate joint effusion. No fracture or dislocation. No appreciable
underlying arthropathy.

## 2022-01-14 ENCOUNTER — Ambulatory Visit
Admission: EM | Admit: 2022-01-14 | Discharge: 2022-01-14 | Disposition: A | Discharge status: Home / Self Care | Payer: BC Managed Care – PPO

## 2022-01-14 DIAGNOSIS — M109 Gout, unspecified: Secondary | ICD-10-CM

## 2022-01-14 DIAGNOSIS — M25562 Pain in left knee: Secondary | ICD-10-CM

## 2022-01-14 MED ORDER — DEXAMETHASONE SODIUM PHOSPHATE 10 MG/ML IJ SOLN
10.0000 mg | Freq: Once | INTRAMUSCULAR | Status: AC
  Administered 2022-01-14: 10 mg via INTRAMUSCULAR

## 2022-01-14 MED ORDER — INDOMETHACIN 50 MG PO CAPS: 50.0000 mg | ORAL_CAPSULE | Freq: Three times a day (TID) | ORAL | 1 refills | Status: AC

## 2022-01-14 MED ORDER — HYDROCODONE-ACETAMINOPHEN 7.5-325 MG PO TABS: 1.0000 | ORAL_TABLET | Freq: Four times a day (QID) | ORAL | 0 refills | Status: AC | PRN

## 2022-01-14 NOTE — ED Triage Notes (Signed)
Pt c/o left knee pain and swelling x2days.  Pt states that the pain goes around the entire knee  Pt states that the pain has been getting worse.   Pt has a knee brace at home and wears it to sleep to help the swelling.   Pt works on his feet and states that he has been standing more than normal.

## 2022-01-14 NOTE — Discharge Instructions (Addendum)
-  I think you are having another gout flareup.  We have given you an injection of a steroid in the clinic. - I sent indomethacin to the pharmacy and pain medication.  You can also take Tylenol and apply ice, topical lidocaine. - Go to ER if your pain or swelling acutely worsen.

## 2022-01-14 NOTE — ED Provider Notes (Signed)
[facility]    CSN: [id number] Arrival date & time: 01/14/22  1128      History   Chief Complaint Chief Complaint  Patient presents with   Knee Pain    HPI Terry Buchanan is a 36 y.o. male presenting for approximately 2-day history of atraumatic left knee pain and swelling.  He denies any injury.  States that he is on his feet all day on concrete.  He says he has had pain like this before in his right knee and it was due to a gout flareup but that caused a septic joint and was a lot more painful than he is experiencing now although he does rate his current pain at 10 out of 10.  He has been taking over-the-counter medication for his symptoms without any relief.  Patient also reports using a knee brace because he says the knee feels a little weak sometimes when he is walking.  He is limping.  Increased pain to bear full weight on his knee.  No fever.  No other complaints.  HPI  Past Medical History:  Diagnosis Date   Gout    Hypertension     There are no problems to display for this patient.   History reviewed. No pertinent surgical history.     Home Medications    Prior to Admission medications   Medication Sig Start Date End Date Taking? Authorizing Provider  HYDROcodone-acetaminophen (NORCO) 7.5-325 MG tablet Take 1 tablet by mouth every 6 (six) hours as needed for up to 5 days for moderate pain. 01/14/22 01/19/22 Yes Danton Clap, PA-C  indomethacin (INDOCIN) 50 MG capsule Take 1 capsule (50 mg total) by mouth 3 (three) times daily with meals for 7 days. 01/14/22 01/21/22 Yes Danton Clap, PA-C  losartan-hydrochlorothiazide [facility]) 50-12.5 MG tablet Take by mouth. 12/31/21 12/31/22 Yes [provider]  metoprolol succinate (TOPROL-XL) 25 MG 24 hr tablet Take 25 mg by mouth daily.    [provider]    Family History Family History  Problem Relation Age of Onset   Cancer Father    Hypertension Father    Gout Father     Social  History Social History   Tobacco Use   Smoking status: Former   Smokeless tobacco: Never  Scientific laboratory technician Use: Never used  Substance Use Topics   Alcohol use: Yes    Comment: occ   Drug use: Never     Allergies   Patient has no known allergies.   Review of Systems Review of Systems  Constitutional:  Negative for fatigue and fever.  Musculoskeletal:  Positive for arthralgias, gait problem and joint swelling.  Neurological:  Negative for weakness and numbness.     Physical Exam Triage Vital Signs ED Triage Vitals  Enc Vitals Group     BP      Pulse      Resp      Temp      Temp src      SpO2      Weight      Height      Head Circumference      Peak Flow      Pain Score      Pain Loc      Pain Edu?      Excl. in [place]?    No data found.  Updated Vital Signs BP (!) 179/119 (BP Location: Left Arm)   Pulse 85   Temp 99.5 F (  37.5 C) (Oral)   Resp 18   Ht 5\' 10"  (1.778 m)   Wt 239 lb (108.4 kg)   SpO2 98%   BMI 34.29 kg/m     BP READINGS (Previous 3 visits) 12/29/21  136/80  11/17/21     148/88 09/28/21    126/82   Physical Exam Vitals and nursing note reviewed.  Constitutional:      General: He is not in acute distress.    Appearance: Normal appearance. He is well-developed. He is not ill-appearing.  HENT:     Head: Normocephalic and atraumatic.  Eyes:     Conjunctiva/sclera: Conjunctivae normal.  Cardiovascular:     Rate and Rhythm: Normal rate and regular rhythm.  Pulmonary:     Effort: Pulmonary effort is normal. No respiratory distress.     Breath sounds: Normal breath sounds.  Musculoskeletal:     Cervical back: Neck supple.     Left knee: Swelling (moderate with increased warmth of skin) present. No erythema. Decreased range of motion (Keeping knee at about 130 degree angle. Difficulty with full extension and with flexion beyond 90 degrees). Tenderness (diffuse TTP of anterior and posterior knee) present.  Skin:    General: Skin is warm  and dry.     Capillary Refill: Capillary refill takes less than 2 seconds.  Neurological:     General: No focal deficit present.     Mental Status: He is alert. Mental status is at baseline.     Motor: No weakness.     Gait: Gait abnormal.  Psychiatric:        Mood and Affect: Mood normal.        Behavior: Behavior normal.      UC Treatments / Results  Labs (all labs ordered are listed, but only abnormal results are displayed) Labs Reviewed - No data to display  EKG   Radiology No results found.  Procedures Procedures (including critical care time)  Medications Ordered in UC Medications  dexamethasone (DECADRON) injection 10 mg (10 mg Intramuscular Given 01/14/22 1212)    Initial Impression / Assessment and Plan / UC Course  I have reviewed the triage vital signs and the nursing notes.  Pertinent labs & imaging results that were available during my care of the patient were reviewed by me and considered in my medical decision making (see chart for details).   36 year old male with history of gout presents for atraumatic left knee pain and swelling for the past 2 days.  Has taken high doses of over-the-counter medication without improvement in symptoms.  Patient's blood pressure is elevated but the last several times he has been seen the blood pressure has been within normal limits.  Suspect it is acutely elevated due to his significant pain which he says is 10 out of 10.  On exam he does have moderate swelling of the anterior knee with some increased warmth but no erythema.  Reduced range of motion especially with full extension and flexion beyond 90 degrees due to pain and guarding.  Diffuse tenderness palpation of the anterior and posterior knee.  Antalgic gait.  Suspect he likely is having flareup of underlying gout.  Patient given 10 mg IM dexamethasone injection in clinic and prescribed indomethacin outpatient as well as Norco after reviewing controlled substance database.   Advised him to ice the knee and elevate it with a pillow under the knee.  Reviewed going to emergency department if the pain or swelling acutely worsen.   Final Clinical Impressions(s) /  UC Diagnoses   Final diagnoses:  Acute gout of left knee, unspecified cause  Acute pain of left knee     Discharge Instructions      -I think you are having another gout flareup.  We have given you an injection of a steroid in the clinic. - I sent indomethacin to the pharmacy and pain medication.  You can also take Tylenol and apply ice, topical lidocaine. - Go to ER if your pain or swelling acutely worsen.     ED Prescriptions     Medication Sig Dispense Auth. Provider   indomethacin (INDOCIN) 50 MG capsule Take 1 capsule (50 mg total) by mouth 3 (three) times daily with meals for 7 days. 21 capsule Laurene Footman B, PA-C   HYDROcodone-acetaminophen (NORCO) 7.5-325 MG tablet Take 1 tablet by mouth every 6 (six) hours as needed for up to 5 days for moderate pain. 15 tablet Danton Clap, PA-C      I have reviewed the PDMP during this encounter.   Danton Clap, PA-C 01/14/22 1225

## 2022-03-15 ENCOUNTER — Ambulatory Visit: Payer: BC Managed Care – PPO

## 2022-03-15 ENCOUNTER — Encounter: Payer: Self-pay | Admitting: Emergency Medicine

## 2022-03-15 ENCOUNTER — Ambulatory Visit
Admission: EM | Admit: 2022-03-15 | Discharge: 2022-03-15 | Disposition: A | Discharge status: Home / Self Care | Payer: BC Managed Care – PPO | Attending: Physician Assistant | Admitting: Physician Assistant

## 2022-03-15 DIAGNOSIS — Z8739 Personal history of other diseases of the musculoskeletal system and connective tissue: Secondary | ICD-10-CM

## 2022-03-15 DIAGNOSIS — M25562 Pain in left knee: Secondary | ICD-10-CM

## 2022-03-15 DIAGNOSIS — M25462 Effusion, left knee: Secondary | ICD-10-CM

## 2022-03-15 DIAGNOSIS — S8992XA Unspecified injury of left lower leg, initial encounter: Secondary | ICD-10-CM | POA: Diagnosis not present

## 2022-03-15 MED ORDER — METHYLPREDNISOLONE 4 MG PO TBPK: ORAL_TABLET | ORAL | 0 refills | Status: DC

## 2022-03-15 MED ORDER — HYDROCODONE-ACETAMINOPHEN 5-325 MG PO TABS: 1.0000 | ORAL_TABLET | Freq: Four times a day (QID) | ORAL | 0 refills | Status: AC | PRN

## 2022-03-15 MED ORDER — KETOROLAC TROMETHAMINE 60 MG/2ML IM SOLN
60.0000 mg | Freq: Once | INTRAMUSCULAR | Status: AC
  Administered 2022-03-15: 60 mg via INTRAMUSCULAR

## 2022-03-15 NOTE — ED Provider Notes (Signed)
[facility]    CSN: [id number] Arrival date & time: 03/15/22  1656      History   Chief Complaint Chief Complaint  Patient presents with   Knee Pain    HPI Terry Buchanan is a 37 y.o. male presenting for approximately 2-day history of left knee pain and swelling.  He says that he was squatting down to pick up toys with his son and when he went to stand back up he felt a pop in his knee.  He reports that he sort of fell forward onto the knee from a squatting position.  He reports that since then he has had increased pain and swelling.  States that he is on his feet all day on concrete.  He says he has had pain like this before in his right knee and it was due to a gout flareup but that caused a septic joint and was a lot more painful than he is experiencing now although he does rate his current pain at 8 out of 10.  He has been taking over-the-counter medication for his symptoms without any relief.  Patient was seen at this urgent care by me 2 months ago for acute flareup of gout in the left knee and followed up with orthopedics within the week after that.  He reports he had images performed and was told he had arthritis and gout.  He says the pain he swelling in this knee now feels little different he thinks it is more related to the injury.  Increased pain to bear full weight on his knee.  No fever.  No other complaints.  HPI  Past Medical History:  Diagnosis Date   Gout    Hypertension     There are no problems to display for this patient.   History reviewed. No pertinent surgical history.     Home Medications    Prior to Admission medications   Medication Sig Start Date End Date Taking? Authorizing Provider  HYDROcodone-acetaminophen (NORCO/VICODIN) 5-325 MG tablet Take 1 tablet by mouth every 6 (six) hours as needed for up to 3 days for severe pain. 03/15/22 03/18/22 Yes Laurene Footman B, PA-C  methylPREDNISolone (MEDROL DOSEPAK) 4 MG TBPK tablet Take according to  Dosepak instructions 03/15/22  Yes Danton Clap, PA-C  losartan-hydrochlorothiazide [facility]) 50-12.5 MG tablet Take by mouth. 12/31/21 12/31/22  [provider]  metoprolol succinate (TOPROL-XL) 25 MG 24 hr tablet Take 25 mg by mouth daily.    [provider]    Family History Family History  Problem Relation Age of Onset   Cancer Father    Hypertension Father    Gout Father     Social History Social History   Tobacco Use   Smoking status: Former   Smokeless tobacco: Never  Scientific laboratory technician Use: Never used  Substance Use Topics   Alcohol use: Yes    Comment: occ   Drug use: Never     Allergies   Lisinopril   Review of Systems Review of Systems  Constitutional:  Negative for fatigue and fever.  Musculoskeletal:  Positive for arthralgias and joint swelling. Negative for gait problem.  Skin:  Positive for color change.  Neurological:  Negative for weakness and numbness.     Physical Exam Triage Vital Signs ED Triage Vitals  Enc Vitals Group     BP      Pulse      Resp      Temp  Temp src      SpO2      Weight      Height      Head Circumference      Peak Flow      Pain Score      Pain Loc      Pain Edu?      Excl. in [place]?    No data found.  Updated Vital Signs BP (!) 136/98 (BP Location: Left Arm)   Pulse (!) 106   Temp 98.9 F (37.2 C) (Oral)   Resp 16   SpO2 98%     BP READINGS (Previous 3 visits) 12/29/21  136/80  11/17/21     148/88 09/28/21    126/82   Physical Exam Vitals and nursing note reviewed.  Constitutional:      General: He is not in acute distress.    Appearance: Normal appearance. He is well-developed. He is not ill-appearing.  HENT:     Head: Normocephalic and atraumatic.  Eyes:     Conjunctiva/sclera: Conjunctivae normal.  Cardiovascular:     Rate and Rhythm: Normal rate and regular rhythm.  Pulmonary:     Effort: Pulmonary effort is normal. No respiratory distress.     Breath sounds: Normal  breath sounds.  Musculoskeletal:     Cervical back: Neck supple.     Left knee: Swelling (moderate with increased warmth of skin) present. No erythema. Decreased range of motion (Keeping knee at about 130 degree angle. Difficulty with full extension and with flexion beyond 90 degrees). Tenderness present over the medial joint line.     Comments: TTP quadriceps (left)  Skin:    General: Skin is warm and dry.     Capillary Refill: Capillary refill takes less than 2 seconds.  Neurological:     General: No focal deficit present.     Mental Status: He is alert. Mental status is at baseline.     Motor: No weakness.     Gait: Gait abnormal.  Psychiatric:        Mood and Affect: Mood normal.        Behavior: Behavior normal.      UC Treatments / Results  Labs (all labs ordered are listed, but only abnormal results are displayed) Labs Reviewed - No data to display  EKG   Radiology No results found.  Procedures Procedures (including critical care time)  Medications Ordered in UC Medications  ketorolac (TORADOL) injection 60 mg (60 mg Intramuscular Given 03/15/22 1751)    Initial Impression / Assessment and Plan / UC Course  I have reviewed the triage vital signs and the nursing notes.  Pertinent labs & imaging results that were available during my care of the patient were reviewed by me and considered in my medical decision making (see chart for details).   37 year old male with history of gout presents for  left knee pain and swelling for the past 2 days.  Pain occurred after he was squatting down to pick up toys with his son and went to stand up.  He reports feeling and hearing a pop at that time.  Larey Seat forward onto the knee from a low height.  Has taken high doses of over-the-counter medication without improvement in symptoms.  On exam he does have moderate swelling of the anterior knee with some increased warmth but no erythema.  Reduced range of motion especially with full  extension and flexion beyond 90 degrees due to pain and guarding.  Tenderness palpation of  the medial knee and quadriceps.  Suspect sprain and possible flareup of underlying gout.  Patient given 60 mg IM ketorolac and prescribed Medrol outpatient as well as Norco after reviewing controlled substance database.  Advised him to ice the knee and elevate it with a pillow under the knee.  Reviewed going to emergency department if the pain or swelling acutely worsen.  Patient has a male with PCP at the end of this month.  Advised him to speak to the PCP about getting on a prophylactic medication to prevent gout flareups.   Final Clinical Impressions(s) / UC Diagnoses   Final diagnoses:  Pain and swelling of left knee  Injury of left knee, initial encounter  History of gout     Discharge Instructions      KNEE PAIN: Stressed avoiding painful activities . Reviewed RICE guidelines. Use medications as directed, including NSAIDs. If no NSAIDs have been prescribed for you today, you may take Aleve or Motrin over the counter. May use Tylenol in between doses of NSAIDs.  If no improvement in the next 1-2 weeks, f/u with PCP or return to our office for reexamination, and please feel free to call or return at any time for any questions or concerns you may have and we will be happy to help you!        ED Prescriptions     Medication Sig Dispense Auth. Provider   methylPREDNISolone (MEDROL DOSEPAK) 4 MG TBPK tablet Take according to Dosepak instructions 21 tablet Danton Clap, PA-C   HYDROcodone-acetaminophen (NORCO/VICODIN) 5-325 MG tablet Take 1 tablet by mouth every 6 (six) hours as needed for up to 3 days for severe pain. 12 tablet Gretta Cool      PDMP not reviewed this encounter.     Danton Clap, PA-C 03/15/22 1756

## 2022-03-15 NOTE — ED Triage Notes (Signed)
Pt presents with left knee swelling and pain x 2 days. Pt states he went to squat down and felt a pop in his knee.

## 2022-03-15 NOTE — Discharge Instructions (Signed)
KNEE PAIN: Stressed avoiding painful activities . Reviewed RICE guidelines. Use medications as directed, including NSAIDs. If no NSAIDs have been prescribed for you today, you may take Aleve or Motrin over the counter. May use Tylenol in between doses of NSAIDs.  If no improvement in the next 1-2 weeks, f/u with PCP or return to our office for reexamination, and please feel free to call or return at any time for any questions or concerns you may have and we will be happy to help you!

## 2022-07-19 ENCOUNTER — Ambulatory Visit
Admission: EM | Admit: 2022-07-19 | Discharge: 2022-07-19 | Disposition: A | Discharge status: Home / Self Care | Payer: BC Managed Care – PPO

## 2022-07-19 ENCOUNTER — Ambulatory Visit (INDEPENDENT_AMBULATORY_CARE_PROVIDER_SITE_OTHER): Payer: BC Managed Care – PPO

## 2022-07-19 DIAGNOSIS — M25561 Pain in right knee: Secondary | ICD-10-CM

## 2022-07-19 DIAGNOSIS — M109 Gout, unspecified: Secondary | ICD-10-CM | POA: Diagnosis not present

## 2022-07-19 DIAGNOSIS — M25461 Effusion, right knee: Secondary | ICD-10-CM

## 2022-07-19 MED ORDER — KETOROLAC TROMETHAMINE 60 MG/2ML IM SOLN
30.0000 mg | Freq: Once | INTRAMUSCULAR | Status: AC
  Administered 2022-07-19: 30 mg via INTRAMUSCULAR

## 2022-07-19 MED ORDER — HYDROCODONE-ACETAMINOPHEN 5-325 MG PO TABS: 1.0000 | ORAL_TABLET | Freq: Four times a day (QID) | ORAL | 0 refills | Status: AC | PRN

## 2022-07-19 MED ORDER — METHYLPREDNISOLONE 4 MG PO TBPK: ORAL_TABLET | ORAL | 0 refills | Status: AC

## 2022-07-19 NOTE — ED Triage Notes (Addendum)
Pt c/o RT knee pain onset last on Friday, pt states he cannot bend knee, throbbing pain. Pt states it has been swelling denies any fall or injury, pt does reports hx of gout. Pt states he has been taking aleve, tylenol, and ibuprofen for pain with little relief.

## 2022-07-19 NOTE — ED Provider Notes (Signed)
[facility]    CSN: [id number] Arrival date & time: 07/19/22  7829      History   Chief Complaint Chief Complaint  Patient presents with   Knee Pain    RT knee    HPI Terry Buchanan is a 37 y.o. male presenting for approximately 3-day history of right knee pain and swelling.  Denies injury. States that he is on his feet all day on concrete.  He says he has had pain like this before in his left knee and it was due to a gout flareup but that caused a septic joint and was a lot more painful than he is experiencing now although he does rate his current pain at 9 out of 10.  He has been taking over-the-counter medication (ibuprofen and Tylenol) for his symptoms without any relief.  Patient has seen at this urgent care by me multiple times for acute flareup of gout in the left knee and followed up with orthopedics soon after. He says they agreed that he is knee issue was due to gout as well as OA.  Patient reports he recently saw his PCP and was prescribed allopurinol but has not been able to start it yet.  Increased pain to bear full weight on his knee.  No fever.  No other complaints.  HPI  Past Medical History:  Diagnosis Date   Gout    Hypertension     There are no problems to display for this patient.   History reviewed. No pertinent surgical history.     Home Medications    Prior to Admission medications   Medication Sig Start Date End Date Taking? Authorizing Provider  allopurinol (ZYLOPRIM) 100 MG tablet Take 1 tablet by mouth daily. 07/05/22  Yes [provider]  colchicine 0.6 MG tablet Take by mouth. 04/05/22  Yes [provider]  HYDROcodone-acetaminophen (NORCO/VICODIN) 5-325 MG tablet Take 1 tablet by mouth every 6 (six) hours as needed for up to 3 days for severe pain. 07/19/22 07/22/22 Yes Shirlee Latch, PA-C  losartan-hydrochlorothiazide [facility]) 50-12.5 MG tablet Take by mouth. 12/31/21 12/31/22 Yes [provider]  metoprolol  succinate (TOPROL-XL) 25 MG 24 hr tablet Take 25 mg by mouth daily.   Yes [provider]  methylPREDNISolone (MEDROL DOSEPAK) 4 MG TBPK tablet Take according to Dosepak instructions 07/19/22   Shirlee Latch, PA-C    Family History Family History  Problem Relation Age of Onset   Cancer Father    Hypertension Father    Gout Father     Social History Social History   Tobacco Use   Smoking status: Former   Smokeless tobacco: Never  Building services engineer Use: Never used  Substance Use Topics   Alcohol use: Yes    Comment: occ   Drug use: Never     Allergies   Lisinopril   Review of Systems Review of Systems  Constitutional:  Negative for fatigue and fever.  Musculoskeletal:  Positive for arthralgias and joint swelling. Negative for gait problem.  Skin:  Positive for color change.  Neurological:  Negative for weakness and numbness.     Physical Exam Triage Vital Signs ED Triage Vitals  Enc Vitals Group     BP      Pulse      Resp      Temp      Temp src      SpO2      Weight  Height      Head Circumference      Peak Flow      Pain Score      Pain Loc      Pain Edu?      Excl. in GC?    No data found.  Updated Vital Signs BP (!) 156/101 (BP Location: Right Arm)   Pulse 77   Temp 98.8 F (37.1 C) (Oral)   Ht 5\' 10"  (1.778 m)   Wt 242 lb (109.8 kg)   SpO2 97%   BMI 34.72 kg/m     BP READINGS (Previous 3 visits) 12/29/21  136/80  11/17/21     148/88 09/28/21    126/82   Physical Exam Vitals and nursing note reviewed.  Constitutional:      General: He is not in acute distress.    Appearance: Normal appearance. He is well-developed. He is not ill-appearing.  HENT:     Head: Normocephalic and atraumatic.  Eyes:     Conjunctiva/sclera: Conjunctivae normal.  Cardiovascular:     Rate and Rhythm: Normal rate and regular rhythm.  Pulmonary:     Effort: Pulmonary effort is normal. No respiratory distress.     Breath sounds: Normal breath  sounds.  Musculoskeletal:     Cervical back: Neck supple.     Right knee: Swelling (moderate) present. No erythema. Decreased range of motion (Keeping knee at about 130 degree angle. Difficulty with full extension and with flexion beyond 90 degrees). Tenderness present over the medial joint line (as well as posterior joint).  Skin:    General: Skin is warm and dry.     Capillary Refill: Capillary refill takes less than 2 seconds.  Neurological:     General: No focal deficit present.     Mental Status: He is alert. Mental status is at baseline.     Motor: No weakness.     Gait: Gait abnormal.  Psychiatric:        Mood and Affect: Mood normal.        Behavior: Behavior normal.      UC Treatments / Results  Labs (all labs ordered are listed, but only abnormal results are displayed) Labs Reviewed - No data to display  EKG   Radiology DG Knee Complete 4 Views Right  Result Date: 07/19/2022 CLINICAL DATA:  Recurrent knee pain and swelling. EXAM: RIGHT KNEE - COMPLETE 4+ VIEW COMPARISON:  Right knee radiographs 07/10/2019 FINDINGS: A moderate-sized knee joint effusion is similar to the prior study. No fracture, dislocation, or destructive osseous lesion is identified. Joint space widths are preserved. IMPRESSION: Moderate knee joint effusion. No acute osseous abnormality. Electronically Signed   By: Sebastian Ache M.D.   On: 07/19/2022 11:00    Procedures Procedures (including critical care time)  Medications Ordered in UC Medications  ketorolac (TORADOL) injection 30 mg (30 mg Intramuscular Given 07/19/22 1128)    Initial Impression / Assessment and Plan / UC Course  I have reviewed the triage vital signs and the nursing notes.  Pertinent labs & imaging results that were available during my care of the patient were reviewed by me and considered in my medical decision making (see chart for details).   37 year old male with history of gout presents for right knee pain and swelling for  the past 3 days.  No injury. History of gout affecting knees. Has taken high doses of over-the-counter medication without improvement in symptoms.  Prescribed allopurinol recently but unable to begin it yet.  On exam he does have moderate swelling of the anterior knee with some increased warmth but no erythema.  Reduced range of motion especially with full extension and flexion beyond 90 degrees due to pain and guarding.  Tenderness palpation of the medial knee and quadriceps.  Suspect flareup of underlying gout.    X-ray performed today shows moderate knee joint effusion but otherwise normal.  Discussed results with patient.  Patient given 30 mg IM ketorolac and prescribed Medrol outpatient as well as Norco after reviewing controlled substance database.  Advised him to ice the knee and elevate it with a pillow under the knee.  Reviewed going to emergency department if the pain or swelling acutely worsen.  Advised to start allopurinol after gout flareup is over.  Follow-up with PCP as scheduled.  Final Clinical Impressions(s) / UC Diagnoses   Final diagnoses:  Acute gout of right knee, unspecified cause  Pain and swelling of right knee   Discharge Instructions   None     ED Prescriptions     Medication Sig Dispense Auth. Provider   methylPREDNISolone (MEDROL DOSEPAK) 4 MG TBPK tablet Take according to Dosepak instructions 21 tablet Shirlee Latch, PA-C   HYDROcodone-acetaminophen (NORCO/VICODIN) 5-325 MG tablet Take 1 tablet by mouth every 6 (six) hours as needed for up to 3 days for severe pain. 10 tablet Shirlee Latch, PA-C      I have reviewed the PDMP during this encounter.     Shirlee Latch, PA-C 07/19/22 1149
